# Patient Record
Sex: Male | Born: 1970 | Race: Black or African American | Hispanic: No | Marital: Single | Smoking: Current every day smoker
Health system: Southern US, Community
[De-identification: ages and names within clinical notes are randomized; demographics above are authoritative.]

## PROBLEM LIST (undated history)

## (undated) DIAGNOSIS — M109 Gout, unspecified: Secondary | ICD-10-CM

## (undated) DIAGNOSIS — I10 Essential (primary) hypertension: Secondary | ICD-10-CM

## (undated) HISTORY — DX: Essential (primary) hypertension: I10

## (undated) HISTORY — DX: Gout, unspecified: M10.9

---

## 2013-09-30 ENCOUNTER — Emergency Department: Payer: Self-pay | Admitting: Emergency Medicine

## 2013-11-25 LAB — COMPREHENSIVE METABOLIC PANEL
Alkaline Phosphatase: 56 U/L
Bilirubin,Total: 0.3 mg/dL (ref 0.2–1.0)
Calcium, Total: 9.1 mg/dL (ref 8.5–10.1)
Chloride: 106 mmol/L (ref 98–107)
Co2: 25 mmol/L (ref 21–32)
Creatinine: 1.21 mg/dL (ref 0.60–1.30)
EGFR (African American): >60
Glucose: 92 mg/dL (ref 65–99)
Potassium: 3.6 mmol/L (ref 3.5–5.1)
SGPT (ALT): 25 U/L (ref 12–78)
Total Protein: 7.7 g/dL (ref 6.4–8.2)

## 2013-11-25 LAB — CBC
HGB: 12.8 g/dL — ABNORMAL LOW (ref 13.0–18.0)
MCH: 33.3 pg (ref 26.0–34.0)
MCHC: 34.1 g/dL (ref 32.0–36.0)
MCV: 98 fL (ref 80–100)
RBC: 3.86 10*6/uL — ABNORMAL LOW (ref 4.40–5.90)
RDW: 13.3 % (ref 11.5–14.5)
WBC: 4.2 10*3/uL (ref 3.8–10.6)

## 2013-11-25 LAB — DIFFERENTIAL
Eosinophil #: 0.1 10*3/uL (ref 0.0–0.7)
Lymphocyte #: 1.7 10*3/uL (ref 1.0–3.6)
Lymphocyte %: 41.9 %
Monocyte %: 11.5 %
Neutrophil %: 43.1 %

## 2013-11-26 ENCOUNTER — Observation Stay: Payer: Self-pay | Admitting: Surgery

## 2013-11-26 LAB — BASIC METABOLIC PANEL
Anion Gap: 4 — ABNORMAL LOW (ref 7–16)
Chloride: 106 mmol/L (ref 98–107)
Co2: 29 mmol/L (ref 21–32)
EGFR (Non-African Amer.): >60
Glucose: 94 mg/dL (ref 65–99)
Osmolality: 281 (ref 275–301)
Potassium: 4 mmol/L (ref 3.5–5.1)

## 2013-11-26 LAB — PROTIME-INR: INR: 0.9

## 2013-11-26 LAB — CBC WITH DIFFERENTIAL/PLATELET
Eosinophil %: 3.6 %
HGB: 12.6 g/dL — ABNORMAL LOW (ref 13.0–18.0)
Lymphocyte %: 46.4 %
MCH: 34 pg (ref 26.0–34.0)
MCV: 97 fL (ref 80–100)
Neutrophil #: 1.6 10*3/uL (ref 1.4–6.5)
RBC: 3.72 10*6/uL — ABNORMAL LOW (ref 4.40–5.90)
WBC: 4.4 10*3/uL (ref 3.8–10.6)

## 2013-11-30 LAB — CULTURE, BLOOD (SINGLE)

## 2014-05-15 ENCOUNTER — Emergency Department: Payer: Self-pay | Admitting: Emergency Medicine

## 2014-05-29 ENCOUNTER — Emergency Department: Payer: Self-pay | Admitting: Emergency Medicine

## 2014-05-29 LAB — COMPREHENSIVE METABOLIC PANEL
ALBUMIN: 4.1 g/dL (ref 3.4–5.0)
ALT: 41 U/L (ref 12–78)
Alkaline Phosphatase: 59 U/L
Anion Gap: 5 — ABNORMAL LOW (ref 7–16)
BILIRUBIN TOTAL: 0.4 mg/dL (ref 0.2–1.0)
BUN: 14 mg/dL (ref 7–18)
CHLORIDE: 109 mmol/L — AB (ref 98–107)
Calcium, Total: 9 mg/dL (ref 8.5–10.1)
Co2: 26 mmol/L (ref 21–32)
Creatinine: 1.22 mg/dL (ref 0.60–1.30)
EGFR (Non-African Amer.): >60
Glucose: 82 mg/dL (ref 65–99)
Osmolality: 279 (ref 275–301)
POTASSIUM: 3.9 mmol/L (ref 3.5–5.1)
SGOT(AST): 42 U/L — ABNORMAL HIGH (ref 15–37)
SODIUM: 140 mmol/L (ref 136–145)
Total Protein: 7.7 g/dL (ref 6.4–8.2)

## 2014-05-29 LAB — CBC
HCT: 42.4 % (ref 40.0–52.0)
HGB: 14.3 g/dL (ref 13.0–18.0)
MCH: 33 pg (ref 26.0–34.0)
MCHC: 33.8 g/dL (ref 32.0–36.0)
MCV: 98 fL (ref 80–100)
Platelet: 209 10*3/uL (ref 150–440)
RBC: 4.34 10*6/uL — ABNORMAL LOW (ref 4.40–5.90)
RDW: 13 % (ref 11.5–14.5)
WBC: 3.5 10*3/uL — AB (ref 3.8–10.6)

## 2014-05-29 LAB — TROPONIN I: Troponin-I: <0.02 ng/mL

## 2014-09-17 ENCOUNTER — Emergency Department: Payer: Self-pay | Admitting: Emergency Medicine

## 2015-01-29 LAB — HEPATIC FUNCTION PANEL
ALK PHOS: 46 U/L (ref 25–125)
ALT: 23 U/L (ref 10–40)
AST: 25 U/L (ref 14–40)
BILIRUBIN, TOTAL: 0.6 mg/dL

## 2015-01-29 LAB — BASIC METABOLIC PANEL
BUN: 19 mg/dL (ref 4–21)
CREATININE: 1.2 mg/dL (ref 0.6–1.3)
Glucose: 91 mg/dL
Potassium: 4.3 mmol/L (ref 3.4–5.3)
Sodium: 139 mmol/L (ref 137–147)

## 2015-01-29 LAB — CBC AND DIFFERENTIAL
HCT: 41 % (ref 41–53)
Hemoglobin: 14 g/dL (ref 13.5–17.5)
Neutrophils Absolute: 2 /uL
PLATELETS: 282 10*3/uL (ref 150–399)
WBC: 4.4 10^3/mL

## 2015-01-29 LAB — LIPID PANEL
Cholesterol: 174 mg/dL (ref 0–200)
HDL: 87 mg/dL — AB (ref 35–70)
LDL Cholesterol: 70 mg/dL
TRIGLYCERIDES: 84 mg/dL (ref 40–160)

## 2015-01-29 LAB — HEMOGLOBIN A1C: HEMOGLOBIN A1C: 5.6

## 2015-01-29 LAB — TSH: TSH: 2.05 u[IU]/mL (ref 0.41–5.90)

## 2015-02-03 ENCOUNTER — Ambulatory Visit: Payer: Self-pay | Admitting: Internal Medicine

## 2015-02-19 DIAGNOSIS — I10 Essential (primary) hypertension: Secondary | ICD-10-CM | POA: Insufficient documentation

## 2015-02-19 DIAGNOSIS — M1990 Unspecified osteoarthritis, unspecified site: Secondary | ICD-10-CM | POA: Insufficient documentation
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
## 2016-08-24 DIAGNOSIS — I10 Essential (primary) hypertension: Secondary | ICD-10-CM

## 2016-08-24 DIAGNOSIS — M1990 Unspecified osteoarthritis, unspecified site: Secondary | ICD-10-CM

## 2016-09-22 IMAGING — CR DG LUMBAR SPINE 2-3V
1 series · 3 of 3 positions shown · non-contrast
Comparison: None.

CLINICAL DATA: Six years of low back pain without known injury.

EXAM:
LUMBAR SPINE - 2-3 VIEW

[Series 1: dxr lumbar spine ap and lateral · 0.14mm/px · 3 of 3 slices shown]
[im 1/3]
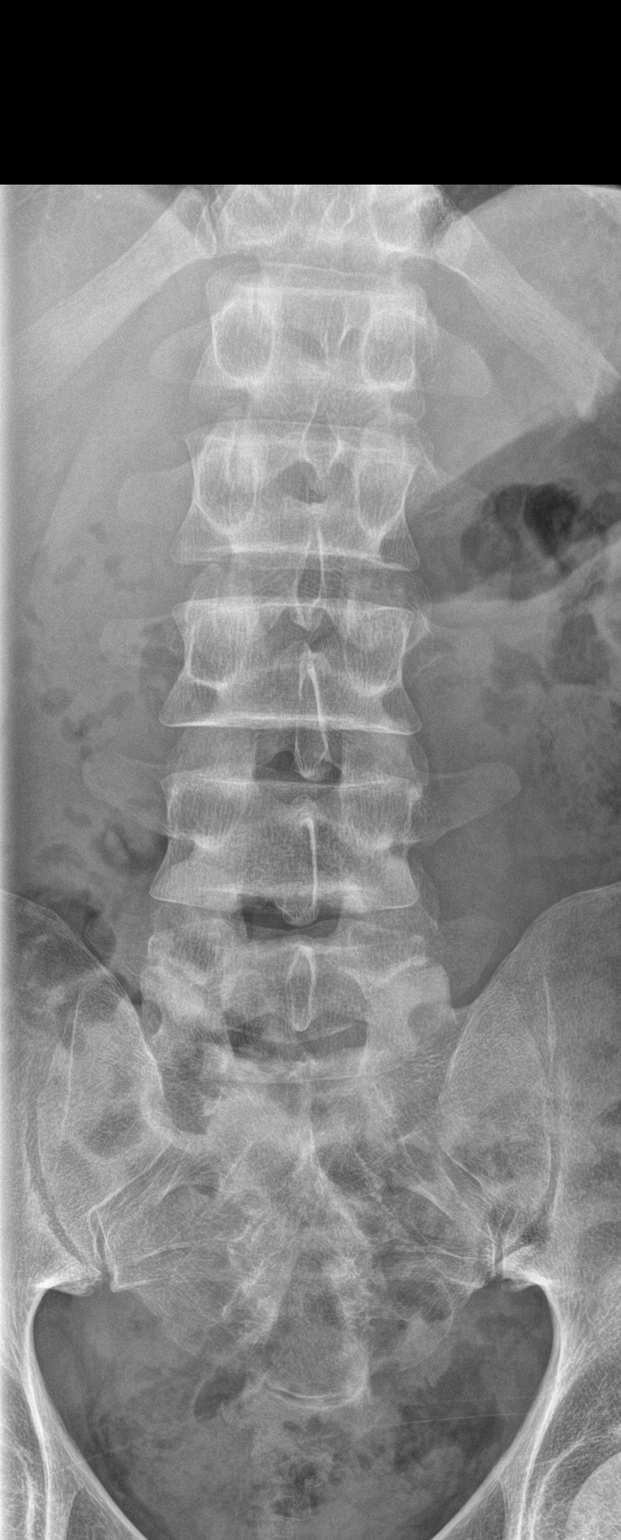
[im 2/3]
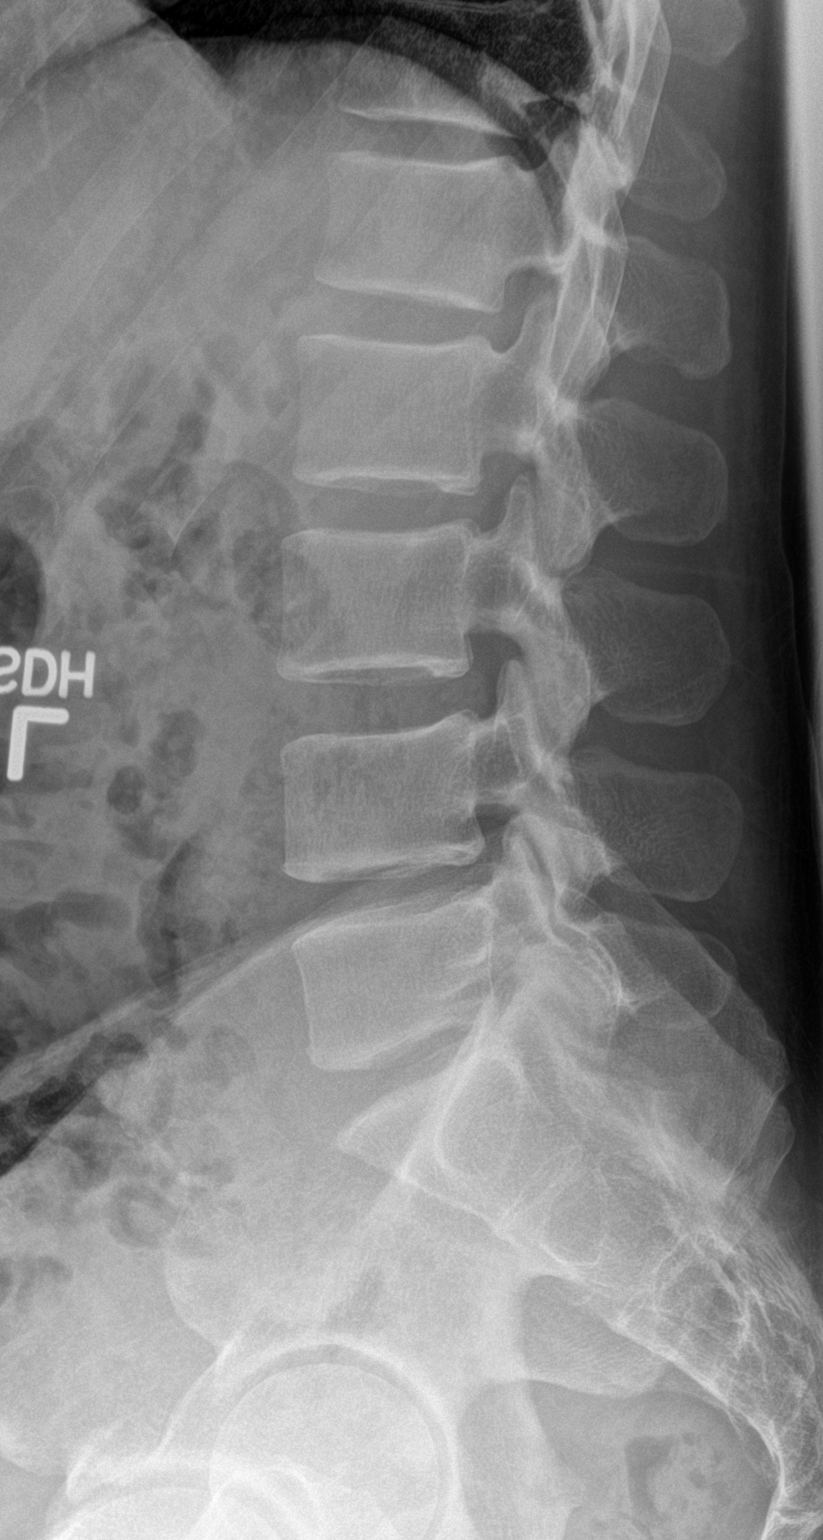
[im 3/3]
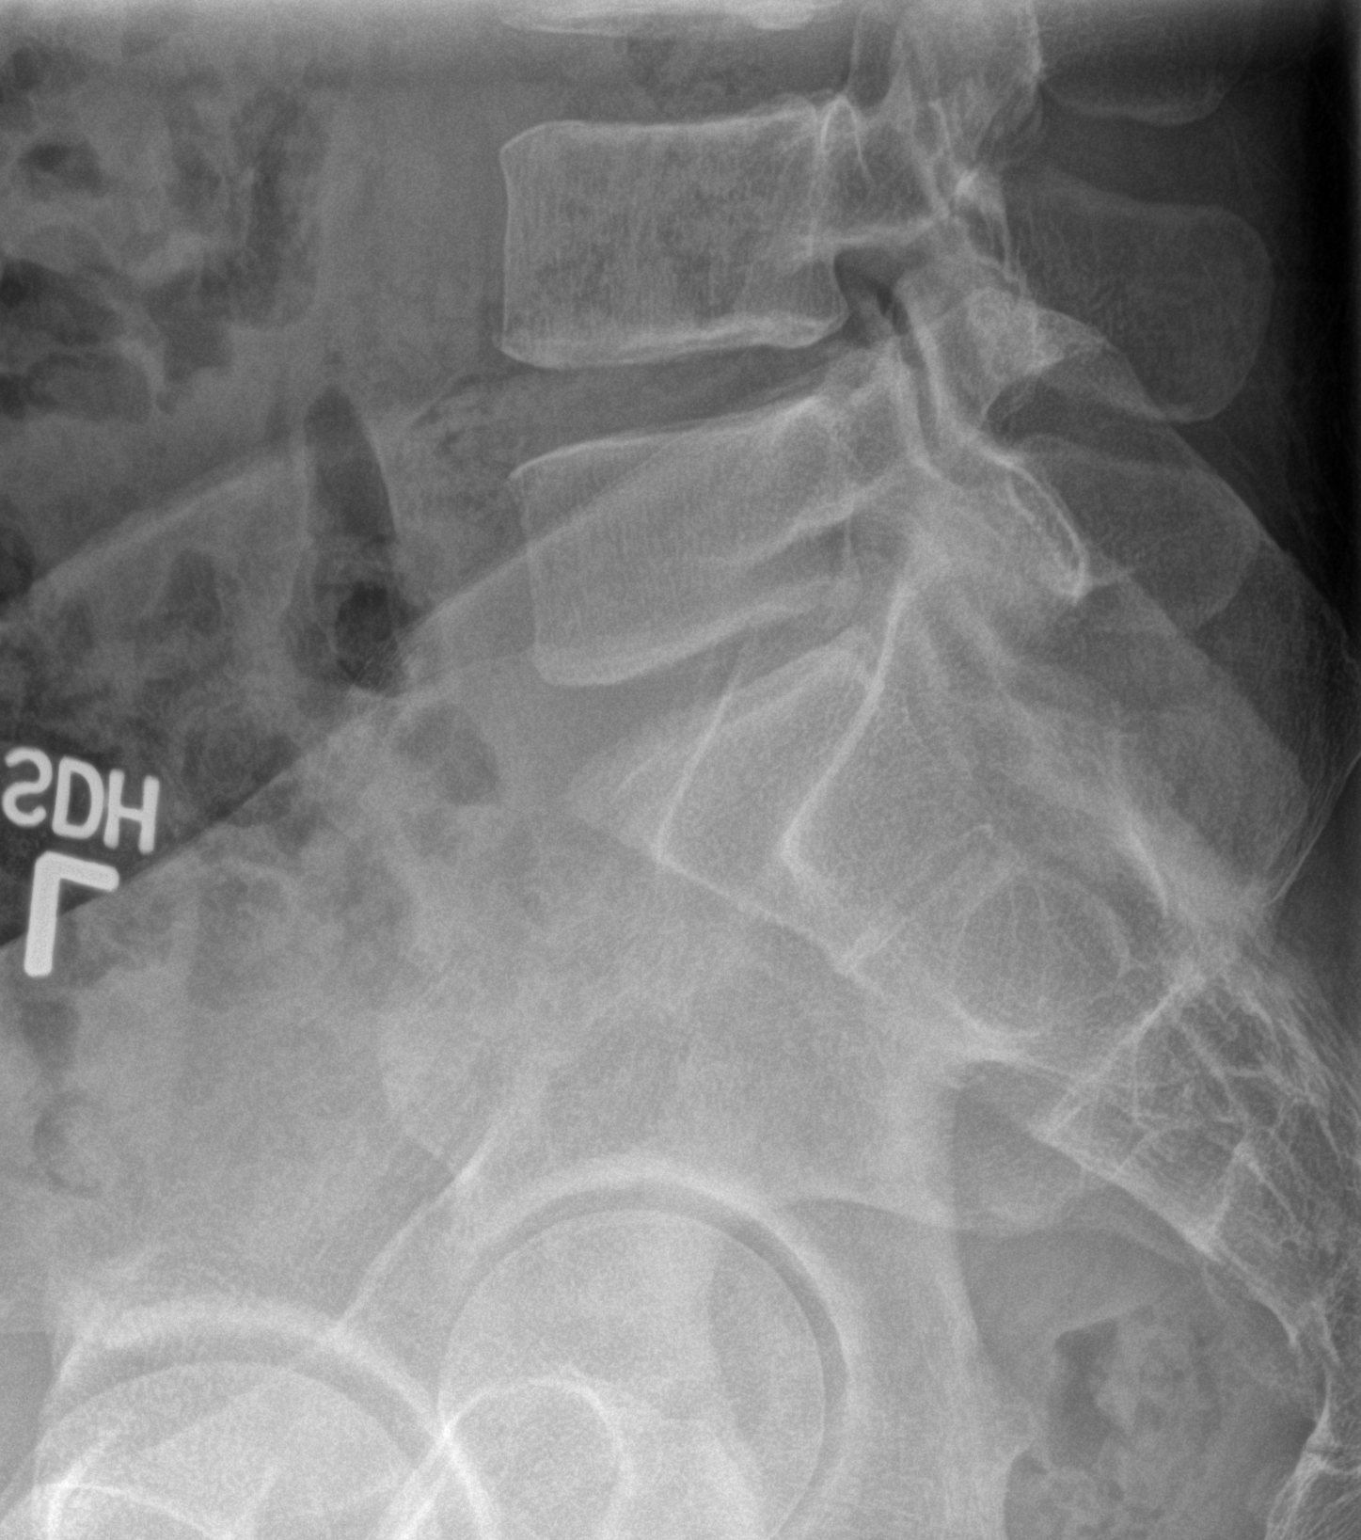

[3 of 3 positions shown; findings below may reference images not displayed]

FINDINGS: The lumbar vertebral bodies are preserved in height. There is mild
disc space narrowing at L4-5. There is no spondylolisthesis. There
is mild facet joint hypertrophy at L4-5 and at L5-S1. The pedicles
and transverse processes are intact. The observed portions of the
sacrum are normal.
IMPRESSION: There is no acute bony abnormality of the lumbar spine. There is
mild degenerative disc space narrowing at L4-5 and mild facet joint
hypertrophy at L4-5 and L5-S1.
